# Patient Record
Sex: Female | Born: 2000 | Race: White | Hispanic: Yes | Marital: Single | Smoking: Never smoker
Health system: Southern US, Community
[De-identification: ages and names within clinical notes are randomized; demographics above are authoritative.]

## PROBLEM LIST (undated history)

## (undated) HISTORY — PX: WISDOM TOOTH EXTRACTION: SHX21

---
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
## 2021-08-14 ENCOUNTER — Encounter: Payer: Self-pay | Admitting: Emergency Medicine

## 2021-08-14 ENCOUNTER — Other Ambulatory Visit: Payer: Self-pay

## 2021-08-14 ENCOUNTER — Ambulatory Visit (INDEPENDENT_AMBULATORY_CARE_PROVIDER_SITE_OTHER): Payer: 59

## 2021-08-14 ENCOUNTER — Ambulatory Visit
Admission: EM | Admit: 2021-08-14 | Discharge: 2021-08-14 | Disposition: A | Discharge status: Home / Self Care | Payer: 59 | Attending: Emergency Medicine | Admitting: Emergency Medicine

## 2021-08-14 DIAGNOSIS — Z112 Encounter for screening for other bacterial diseases: Secondary | ICD-10-CM | POA: Insufficient documentation

## 2021-08-14 DIAGNOSIS — R042 Hemoptysis: Secondary | ICD-10-CM | POA: Insufficient documentation

## 2021-08-14 DIAGNOSIS — R21 Rash and other nonspecific skin eruption: Secondary | ICD-10-CM | POA: Diagnosis not present

## 2021-08-14 LAB — PROTIME-INR
INR: 1.1 (ref 0.8–1.2)
Prothrombin Time: 14.5 seconds (ref 11.4–15.2)

## 2021-08-14 LAB — APTT: aPTT: 34 seconds (ref 24–36)

## 2021-08-14 LAB — COMPREHENSIVE METABOLIC PANEL
ALT: 12 U/L (ref 0–44)
AST: 19 U/L (ref 15–41)
Albumin: 4.5 g/dL (ref 3.5–5.0)
Alkaline Phosphatase: 57 U/L (ref 38–126)
Anion gap: 5 (ref 5–15)
BUN: 15 mg/dL (ref 6–20)
CO2: 27 mmol/L (ref 22–32)
Calcium: 9.5 mg/dL (ref 8.9–10.3)
Chloride: 104 mmol/L (ref 98–111)
Creatinine, Ser: 0.59 mg/dL (ref 0.44–1.00)
GFR, Estimated: >60 mL/min (ref >60)
Glucose, Bld: 103 mg/dL — ABNORMAL HIGH (ref 70–99)
Potassium: 3.9 mmol/L (ref 3.5–5.1)
Sodium: 136 mmol/L (ref 135–145)
Total Bilirubin: 0.6 mg/dL (ref 0.3–1.2)
Total Protein: 8 g/dL (ref 6.5–8.1)

## 2021-08-14 LAB — CBC WITH DIFFERENTIAL/PLATELET
Abs Immature Granulocytes: 0.01 10*3/uL (ref 0.00–0.07)
Basophils Absolute: 0 10*3/uL (ref 0.0–0.1)
Basophils Relative: 1 %
Eosinophils Absolute: 0 10*3/uL (ref 0.0–0.5)
Eosinophils Relative: 1 %
HCT: 39.4 % (ref 36.0–46.0)
Hemoglobin: 12.8 g/dL (ref 12.0–15.0)
Immature Granulocytes: 0 %
Lymphocytes Relative: 46 %
Lymphs Abs: 1.8 10*3/uL (ref 0.7–4.0)
MCH: 27.9 pg (ref 26.0–34.0)
MCHC: 32.5 g/dL (ref 30.0–36.0)
MCV: 85.8 fL (ref 80.0–100.0)
Monocytes Absolute: 0.2 10*3/uL (ref 0.1–1.0)
Monocytes Relative: 6 %
Neutro Abs: 1.8 10*3/uL (ref 1.7–7.7)
Neutrophils Relative %: 46 %
Platelets: 275 10*3/uL (ref 150–400)
RBC: 4.59 MIL/uL (ref 3.87–5.11)
RDW: 13.9 % (ref 11.5–15.5)
WBC: 3.8 10*3/uL — ABNORMAL LOW (ref 4.0–10.5)
nRBC: 0 % (ref 0.0–0.2)

## 2021-08-14 LAB — GROUP A STREP BY PCR: Group A Strep by PCR: NOT DETECTED

## 2021-08-14 NOTE — Discharge Instructions (Addendum)
-  Your chest x-ray and lab work are normal so far.  We did not see any changes on the chest x-ray, though I am concerned that you have been coughing up blood for 6 months.  The strep test is negative.  Your lab work is normal so far, but we are still waiting on the coagulation studies. -Please follow-up with your primary care provider given chronicity of symptoms, you would likely benefit from referral to dermatology, allergy, pulmonology. -If symptoms get worse, including new shortness of breath, chest pain, new cough and increasing amount of blood, dizziness, weakness-head to the emergency department or call 911.

## 2021-08-14 NOTE — ED Provider Notes (Signed)
[facility]    CSN: [id number] Arrival date & time: 08/14/21  1202      History   Chief Complaint Chief Complaint  Patient presents with   Rash    HPI Miranda Patel is a 21 y.o. female presenting with 1 small red bump on her left cheek for 2 days; rash intermittently for 6 months; hemoptysis.  No formal diagnosis of strep or pulmonary disease, but she states that she was clinically diagnosed with strep 6 months ago at around the same time the rash occurred then.  She states that she has intermittently had small red papules on her thighs, face for the last 6 months, she has not identified triggers but they disappear after a few days.  They are not associated with pain or itching.  She states that she has had several episodes of hemoptysis over the last 6 months, most recently 1 day ago.  She states that she coughed up a large chunk of dark red blood.  She is currently feeling well, without sore throat cough, congestion, shortness of breath, chest pain, dizziness, weakness, fevers.  She states that she does occasionally feel short of breath, but she attributes this to anxiousness. She is not having vaginal or urinary symptoms, denies abd pain or flank pain. She is currently menstruating. Denies recent travel, prolonged immobilization, recent surgery, recent trauma, OCP use, history of clots, history of DVT, history of PE, cigarette smoking. She did eat salmon one day ago, but denies outdoor exposure, new products, new fragrances. Denies sensation of throat closing, facial swelling, current SOB or CP. Here today with mom. Denies night sweats or weight loss.    HPI  History reviewed. No pertinent past medical history.  There are no problems to display for this patient.   History reviewed. No pertinent surgical history.  OB History   No obstetric history on file.      Home Medications    Prior to Admission medications   Not on File    Family History History  reviewed. No pertinent family history.  Social History Social History   Tobacco Use   Smoking status: Never   Smokeless tobacco: Never  Vaping Use   Vaping Use: Never used  Substance Use Topics   Alcohol use: Never     Allergies   Patient has no known allergies.   Review of Systems Review of Systems  Skin:  Positive for rash.  All other systems reviewed and are negative.   Physical Exam Triage Vital Signs ED Triage Vitals  Enc Vitals Group     BP 08/14/21 1235 117/85     Pulse Rate 08/14/21 1235 90     Resp 08/14/21 1235 14     Temp 08/14/21 1235 98.6 F (37 C)     Temp Source 08/14/21 1235 Oral     SpO2 08/14/21 1235 100 %     Weight 08/14/21 1231 93 lb (42.2 kg)     Height 08/14/21 1231 5' 2.5" (1.588 m)     Head Circumference --      Peak Flow --      Pain Score 08/14/21 1231 0     Pain Loc --      Pain Edu? --      Excl. in [place]? --    No data found.  Updated Vital Signs BP 117/85 (BP Location: Right Arm)   Pulse 90   Temp 98.6 F (37 C) (Oral)   Resp 14   Ht  5' 2.5" (1.588 m)   Wt 93 lb (42.2 kg)   LMP 08/09/2021 (Exact Date)   SpO2 100%   BMI 16.74 kg/m   Visual Acuity Right Eye Distance:   Left Eye Distance:   Bilateral Distance:    Right Eye Near:   Left Eye Near:    Bilateral Near:     Physical Exam Vitals reviewed.  Constitutional:      General: She is not in acute distress.    Appearance: Normal appearance. She is not ill-appearing or diaphoretic.  HENT:     Head: Normocephalic and atraumatic.     Mouth/Throat:     Comments: Tonsils are small and nonerythematous. On exam, uvula is midline, she is tolerating her secretions without difficulty, there is no trismus, no drooling, she has normal phonation  Cardiovascular:     Rate and Rhythm: Normal rate and regular rhythm.     Heart sounds: Normal heart sounds.     Comments: Negative homan sign bilaterally. Calves are equal and symmetric. Pulmonary:     Effort: Pulmonary effort  is normal.     Breath sounds: Normal breath sounds. No decreased breath sounds, wheezing, rhonchi or rales.  Musculoskeletal:     Right lower leg: No edema.     Left lower leg: No edema.  Skin:    General: Skin is warm.     Findings: Petechiae present.     Comments: There is one small 38mm erythematous papule on L cheek. No other rash or lesion. No lip, tongue, uvula involvement.  Neurological:     General: No focal deficit present.     Mental Status: She is alert and oriented to person, place, and time.  Psychiatric:        Mood and Affect: Mood normal.        Behavior: Behavior normal.        Thought Content: Thought content normal.        Judgment: Judgment normal.     UC Treatments / Results  Labs (all labs ordered are listed, but only abnormal results are displayed) Labs Reviewed  CBC WITH DIFFERENTIAL/PLATELET - Abnormal; Notable for the following components:      Result Value   WBC 3.8 (*)    All other components within normal limits  COMPREHENSIVE METABOLIC PANEL - Abnormal; Notable for the following components:   Glucose, Bld 103 (*)    All other components within normal limits  GROUP A STREP BY PCR  APTT  PROTIME-INR    EKG   Radiology DG Chest 2 View  Result Date: 08/14/2021 CLINICAL DATA:  Intermittent hemoptysis x6 months EXAM: CHEST - 2 VIEW COMPARISON:  Images of previous study done on 07/30/2003 are not available for comparison. FINDINGS: Cardiac size is within normal limits. Increase in AP diameter of chest and flattening of diaphragms may be normal variation due to patient's body habitus or suggest air trapping. There is no significant peribronchial thickening. There is no focal pulmonary consolidation. There is no pleural effusion or pneumothorax. IMPRESSION: No focal pulmonary infiltrates are seen. No discrete lung nodules are seen. There is no pleural effusion or pneumothorax. Increase in AP diameter of chest may be normal variation due to patient's body  habitus or suggest air trapping. Electronically Signed   By: Ernie Avena M.D.   On: 08/14/2021 13:39    Procedures Procedures (including critical care time)  Medications Ordered in UC Medications - No data to display  Initial Impression / Assessment and Plan /  UC Course  I have reviewed the triage vital signs and the nursing notes.  Pertinent labs & imaging results that were available during my care of the patient were reviewed by me and considered in my medical decision making (see chart for details).     This patient is a very pleasant 21 y.o. year old female presenting with one small erythematous papule L cheek. Afebrile, nontachy, no adventitious breath sounds.  She has apparently been dealing with intermittent petechiae and hemoptysis for the last 6 months, this is the first time she has sought care for this.  Today, the only symptom is the one small papule on the left cheek. There is no associated chest pain, shortness of breath, weight loss, fevers, sore throat.  Strep PCR is negative. CXR - No focal pulmonary infiltrates are seen. No discrete lung nodules are seen. There is no pleural effusion or pneumothorax. CBC and CMP today are wnl. Will send a PTT and INR given 6 months of intermittent petechiae (per pt). Wells score for DVT is 0, Perc score is 0.  No recent travel, immobilization, OCP use, cigarette smoking.  No recent exposures to the outdoors, fragrance, or new foods.  Reassurance provided today, but strongly advised follow-up with PCP, as she could benefit from monitoring and referral to dermatology.  Mom and patient are in agreement.    Final Clinical Impressions(s) / UC Diagnoses   Final diagnoses:  None   Discharge Instructions   None    ED Prescriptions   None    PDMP not reviewed this encounter.

## 2021-08-14 NOTE — ED Triage Notes (Signed)
Patient reports red bumps on the side of her face and inside her mouth that started 2 nights ago.  Patient denies any sore throat.  Patient denies fevers.

## 2022-04-09 ENCOUNTER — Ambulatory Visit
Admission: EM | Admit: 2022-04-09 | Discharge: 2022-04-09 | Disposition: A | Discharge status: Home / Self Care | Payer: Managed Care, Other (non HMO) | Attending: Physician Assistant | Admitting: Physician Assistant

## 2022-04-09 ENCOUNTER — Encounter: Payer: Self-pay | Admitting: Emergency Medicine

## 2022-04-09 DIAGNOSIS — R519 Headache, unspecified: Secondary | ICD-10-CM

## 2022-04-09 DIAGNOSIS — R202 Paresthesia of skin: Secondary | ICD-10-CM | POA: Insufficient documentation

## 2022-04-09 DIAGNOSIS — R000 Tachycardia, unspecified: Secondary | ICD-10-CM

## 2022-04-09 DIAGNOSIS — R42 Dizziness and giddiness: Secondary | ICD-10-CM | POA: Diagnosis not present

## 2022-04-09 LAB — CBC WITH DIFFERENTIAL/PLATELET
Abs Immature Granulocytes: 0.02 10*3/uL (ref 0.00–0.07)
Basophils Absolute: 0 10*3/uL (ref 0.0–0.1)
Basophils Relative: 1 %
Eosinophils Absolute: 0 10*3/uL (ref 0.0–0.5)
Eosinophils Relative: 0 %
HCT: 38.5 % (ref 36.0–46.0)
Hemoglobin: 13 g/dL (ref 12.0–15.0)
Immature Granulocytes: 0 %
Lymphocytes Relative: 20 %
Lymphs Abs: 1.2 10*3/uL (ref 0.7–4.0)
MCH: 28 pg (ref 26.0–34.0)
MCHC: 33.8 g/dL (ref 30.0–36.0)
MCV: 83 fL (ref 80.0–100.0)
Monocytes Absolute: 0.4 10*3/uL (ref 0.1–1.0)
Monocytes Relative: 7 %
Neutro Abs: 4.3 10*3/uL (ref 1.7–7.7)
Neutrophils Relative %: 72 %
Platelets: 343 10*3/uL (ref 150–400)
RBC: 4.64 MIL/uL (ref 3.87–5.11)
RDW: 15.4 % (ref 11.5–15.5)
WBC: 5.9 10*3/uL (ref 4.0–10.5)
nRBC: 0 % (ref 0.0–0.2)

## 2022-04-09 LAB — COMPREHENSIVE METABOLIC PANEL
ALT: 13 U/L (ref 0–44)
AST: 18 U/L (ref 15–41)
Albumin: 4.6 g/dL (ref 3.5–5.0)
Alkaline Phosphatase: 57 U/L (ref 38–126)
Anion gap: 9 (ref 5–15)
BUN: 18 mg/dL (ref 6–20)
CO2: 25 mmol/L (ref 22–32)
Calcium: 9.6 mg/dL (ref 8.9–10.3)
Chloride: 103 mmol/L (ref 98–111)
Creatinine, Ser: 0.63 mg/dL (ref 0.44–1.00)
GFR, Estimated: >60 mL/min (ref >60)
Glucose, Bld: 110 mg/dL — ABNORMAL HIGH (ref 70–99)
Potassium: 4.2 mmol/L (ref 3.5–5.1)
Sodium: 137 mmol/L (ref 135–145)
Total Bilirubin: 0.7 mg/dL (ref 0.3–1.2)
Total Protein: 7.8 g/dL (ref 6.5–8.1)

## 2022-04-09 LAB — URINALYSIS, ROUTINE W REFLEX MICROSCOPIC
Bilirubin Urine: NEGATIVE
Glucose, UA: NEGATIVE mg/dL
Ketones, ur: 15 mg/dL — AB
Nitrite: NEGATIVE
Protein, ur: NEGATIVE mg/dL
Specific Gravity, Urine: 1.015 (ref 1.005–1.030)
pH: 6 (ref 5.0–8.0)

## 2022-04-09 LAB — PREGNANCY, URINE: Preg Test, Ur: NEGATIVE

## 2022-04-09 LAB — GLUCOSE, CAPILLARY: Glucose-Capillary: 109 mg/dL — ABNORMAL HIGH (ref 70–99)

## 2022-04-09 LAB — URINALYSIS, MICROSCOPIC (REFLEX)

## 2022-04-09 NOTE — ED Triage Notes (Signed)
Patient is with her mother today.  Mother states that her daughter has c/o SOB, chest pain and fast heart that started today.  Mother states that she was seen Friday on [facility] ED for headaches and anxiety/panic attacks.

## 2022-04-09 NOTE — Discharge Instructions (Signed)
Please increase your salt consumption as well as drink plenty of fluid.  I would like you to follow-up with a cardiologist.  Please call them to schedule an appointment.  We will contact you if any of your other blood work is abnormal.  If you have any syncopal episodes or worsening symptoms you need to go to the emergency room.  Follow-up with neurology as recommended by the ER.

## 2022-04-09 NOTE — ED Provider Notes (Signed)
[facility]    CSN: [id number] Arrival date & time: 04/09/22  1139      History   Chief Complaint Chief Complaint  Patient presents with   Anxiety   Headache   Dizziness    HPI Miranda Patel is a 22 y.o. female.   Patient presents today companied by her mother who provide the majority of history.  Reports that for the past month she has had intermittent symptoms including numbness/paresthesias in her right arm, lightheadedness/near syncope, shortness of breath, heart racing, headache with radiation to the eye.  She has been evaluated and previously was attributed to anxiety.  She was seen several days ago in the emergency room at which point she had a negative workup including negative MRI.  She denies any recent medication changes.  Reports she is eating and drinking normally.  She denies personal or family history of neurological condition including multiple sclerosis.  Denies any alcohol or drug use.  She has not identified any triggers that cause symptoms.  She has not had any syncopal episodes.  She has tried Atarax as prescribed by the emergency room but this has provided no relief of symptoms and in fact she felt worse today.  She reports that 3 weeks ago she had COVID but has recovered completely.  Her symptoms began prior to this illness.  She denies any history of diabetes.  She denies any heavy metal exposure.  Reports she is having difficulty with daily activities as a result of symptoms.    History reviewed. No pertinent past medical history.  There are no problems to display for this patient.   Past Surgical History:  Procedure Laterality Date   WISDOM TOOTH EXTRACTION      OB History   No obstetric history on file.      Home Medications    Prior to Admission medications   Medication Sig Start Date End Date Taking? Authorizing Provider  hydrOXYzine (ATARAX) 25 MG tablet Take by mouth. 02/24/22  Yes [provider]    Family  History History reviewed. No pertinent family history.  Social History Social History   Tobacco Use   Smoking status: Never   Smokeless tobacco: Never  Vaping Use   Vaping Use: Never used  Substance Use Topics   Alcohol use: Never     Allergies   Patient has no known allergies.   Review of Systems Review of Systems  Constitutional:  Positive for activity change. Negative for appetite change, fatigue and fever.  Eyes:  Negative for photophobia and visual disturbance.  Respiratory:  Negative for cough and shortness of breath.   Cardiovascular:  Negative for chest pain.  Gastrointestinal:  Negative for abdominal pain, diarrhea, nausea and vomiting.  Neurological:  Positive for light-headedness, numbness and headaches. Negative for dizziness, syncope, facial asymmetry, speech difficulty and weakness.     Physical Exam Triage Vital Signs ED Triage Vitals  Enc Vitals Group     BP 04/09/22 1249 (!) 131/90     Pulse Rate 04/09/22 1249 (!) 108     Resp 04/09/22 1249 14     Temp 04/09/22 1249 98 F (36.7 C)     Temp Source 04/09/22 1249 Oral     SpO2 04/09/22 1249 99 %     Weight 04/09/22 1247 93 lb 0.6 oz (42.2 kg)     Height 04/09/22 1247 5' 2.5" (1.588 m)     Head Circumference --      Peak Flow --  Pain Score 04/09/22 1247 0     Pain Loc --      Pain Edu? --      Excl. in [place]? --    No data found.  Updated Vital Signs BP (!) 131/90 (BP Location: Left Arm)   Pulse (!) 108   Temp 98 F (36.7 C) (Oral)   Resp 14   Ht 5' 2.5" (1.588 m)   Wt 93 lb 0.6 oz (42.2 kg)   LMP 03/26/2022   SpO2 99%   BMI 16.75 kg/m   Orthostatic vital signs: Lying down: BP 110/74 and heart rate 79 Sitting: BP 106/70 and heart rate of 98 Standing at 3 minutes: 99/65 with heart rate of 119  Visual Acuity Right Eye Distance:   Left Eye Distance:   Bilateral Distance:    Right Eye Near:   Left Eye Near:    Bilateral Near:     Physical Exam Vitals reviewed.   Constitutional:      General: She is awake. She is not in acute distress.    Appearance: Normal appearance. She is well-developed. She is not ill-appearing.     Comments: Very pleasant female appears stated age in no acute distress sitting comfortably in exam room  HENT:     Head: Normocephalic and atraumatic. No raccoon eyes, Battle's sign or contusion.     Right Ear: Tympanic membrane, ear canal and external ear normal. No hemotympanum.     Left Ear: Tympanic membrane, ear canal and external ear normal. No hemotympanum.     Mouth/Throat:     Tongue: Tongue does not deviate from midline.     Pharynx: Uvula midline. No oropharyngeal exudate or posterior oropharyngeal erythema.  Eyes:     Extraocular Movements: Extraocular movements intact.     Pupils: Pupils are equal, round, and reactive to light.  Cardiovascular:     Rate and Rhythm: Normal rate and regular rhythm.     Heart sounds: Normal heart sounds, S1 normal and S2 normal. No murmur heard. Pulmonary:     Effort: Pulmonary effort is normal.     Breath sounds: Normal breath sounds. No wheezing, rhonchi or rales.     Comments: Clear to auscultation bilaterally Musculoskeletal:     Cervical back: No spinous process tenderness or muscular tenderness.     Comments: Strength 5/5 bilateral upper and lower extremities  Lymphadenopathy:     Head:     Right side of head: No submental, submandibular or tonsillar adenopathy.     Left side of head: No submental, submandibular or tonsillar adenopathy.  Neurological:     General: No focal deficit present.     Mental Status: She is alert and oriented to person, place, and time.     Cranial Nerves: Cranial nerves 2-12 are intact.     Motor: Motor function is intact.     Coordination: Coordination is intact.     Gait: Gait is intact.     Comments: No focal neurological defect on exam  Psychiatric:        Behavior: Behavior is cooperative.      UC Treatments / Results  Labs (all labs  ordered are listed, but only abnormal results are displayed) Labs Reviewed  COMPREHENSIVE METABOLIC PANEL - Abnormal; Notable for the following components:      Result Value   Glucose, Bld 110 (*)    All other components within normal limits  URINALYSIS, ROUTINE W REFLEX MICROSCOPIC - Abnormal; Notable for the following components:  Hgb urine dipstick TRACE (*)    Ketones, ur 15 (*)    Leukocytes,Ua SMALL (*)    All other components within normal limits  GLUCOSE, CAPILLARY - Abnormal; Notable for the following components:   Glucose-Capillary 109 (*)    All other components within normal limits  URINALYSIS, MICROSCOPIC (REFLEX) - Abnormal; Notable for the following components:   Bacteria, UA MANY (*)    All other components within normal limits  URINE CULTURE  CBC WITH DIFFERENTIAL/PLATELET  PREGNANCY, URINE  LYME DISEASE SEROLOGY W/REFLEX  VITAMIN B12  HEMOGLOBIN A1C  CBG MONITORING, ED    EKG   Radiology No results found.  Procedures Procedures (including critical care time)  Medications Ordered in UC Medications - No data to display  Initial Impression / Assessment and Plan / UC Course  I have reviewed the triage vital signs and the nursing notes.  Pertinent labs & imaging results that were available during my care of the patient were reviewed by me and considered in my medical decision making (see chart for details).     EKG was obtained that showed normal sinus rhythm with ventricular rate of 87 bpm with biphasic T waves in V3 but no ischemic changes; no previous to compare.  Her blood sugar was appropriate.  She did have a near syncopal episode during blood draw but since recovered with no ongoing symptoms.  UA shows some bacteria but also shows some squamous cells.  Will send this for culture but defer antibiotics.  Urine pregnancy was negative.  Orthostatic vital signs did show elevated heart rate with lying to standing concerning for POTS.  Recommended follow-up  with cardiology and she was given contact information for local provider with instruction call to schedule an appointment.  CBC and CMP were unremarkable.  Lyme, B12, A1c are pending.  She was encouraged to eat more salt and drink plenty of fluid.   She already has follow-up scheduled with neurology with strongly encouraged to keep this appointment.  Discussed that if she has any worsening or changing symptoms she is to go to the emergency room immediately.  Strict return precautions given. School excuse note provided.   Final Clinical Impressions(s) / UC Diagnoses   Final diagnoses:  Episodic lightheadedness  Tachycardia  Paresthesias  Nonintractable headache, unspecified chronicity pattern, unspecified headache type     Discharge Instructions      Please increase your salt consumption as well as drink plenty of fluid.  I would like you to follow-up with a cardiologist.  Please call them to schedule an appointment.  We will contact you if any of your other blood work is abnormal.  If you have any syncopal episodes or worsening symptoms you need to go to the emergency room.  Follow-up with neurology as recommended by the ER.     ED Prescriptions   None    PDMP not reviewed this encounter.   Jeani Hawking, PA-C 04/09/22 1655

## 2022-04-10 ENCOUNTER — Emergency Department: Payer: Managed Care, Other (non HMO)

## 2022-04-10 ENCOUNTER — Other Ambulatory Visit: Payer: Self-pay

## 2022-04-10 ENCOUNTER — Emergency Department
Admission: EM | Admit: 2022-04-10 | Discharge: 2022-04-10 | Disposition: A | Discharge status: Home / Self Care | Payer: Managed Care, Other (non HMO) | Attending: Emergency Medicine | Admitting: Emergency Medicine

## 2022-04-10 ENCOUNTER — Encounter: Payer: Self-pay | Admitting: Emergency Medicine

## 2022-04-10 DIAGNOSIS — R531 Weakness: Secondary | ICD-10-CM | POA: Diagnosis not present

## 2022-04-10 DIAGNOSIS — R519 Headache, unspecified: Secondary | ICD-10-CM | POA: Diagnosis present

## 2022-04-10 DIAGNOSIS — U071 COVID-19: Secondary | ICD-10-CM | POA: Diagnosis not present

## 2022-04-10 LAB — COMPREHENSIVE METABOLIC PANEL
ALT: 12 U/L (ref 0–44)
AST: 16 U/L (ref 15–41)
Albumin: 4.5 g/dL (ref 3.5–5.0)
Alkaline Phosphatase: 52 U/L (ref 38–126)
Anion gap: 10 (ref 5–15)
BUN: 11 mg/dL (ref 6–20)
CO2: 25 mmol/L (ref 22–32)
Calcium: 9.4 mg/dL (ref 8.9–10.3)
Chloride: 106 mmol/L (ref 98–111)
Creatinine, Ser: 0.65 mg/dL (ref 0.44–1.00)
GFR, Estimated: >60 mL/min (ref >60)
Glucose, Bld: 93 mg/dL (ref 70–99)
Potassium: 4.1 mmol/L (ref 3.5–5.1)
Sodium: 141 mmol/L (ref 135–145)
Total Bilirubin: 0.8 mg/dL (ref 0.3–1.2)
Total Protein: 7.4 g/dL (ref 6.5–8.1)

## 2022-04-10 LAB — CBC WITH DIFFERENTIAL/PLATELET
Abs Immature Granulocytes: 0.02 10*3/uL (ref 0.00–0.07)
Basophils Absolute: 0 10*3/uL (ref 0.0–0.1)
Basophils Relative: 1 %
Eosinophils Absolute: 0.1 10*3/uL (ref 0.0–0.5)
Eosinophils Relative: 1 %
HCT: 40 % (ref 36.0–46.0)
Hemoglobin: 12.6 g/dL (ref 12.0–15.0)
Immature Granulocytes: 0 %
Lymphocytes Relative: 23 %
Lymphs Abs: 1.1 10*3/uL (ref 0.7–4.0)
MCH: 27.5 pg (ref 26.0–34.0)
MCHC: 31.5 g/dL (ref 30.0–36.0)
MCV: 87.1 fL (ref 80.0–100.0)
Monocytes Absolute: 0.2 10*3/uL (ref 0.1–1.0)
Monocytes Relative: 5 %
Neutro Abs: 3.2 10*3/uL (ref 1.7–7.7)
Neutrophils Relative %: 70 %
Platelets: 305 10*3/uL (ref 150–400)
RBC: 4.59 MIL/uL (ref 3.87–5.11)
RDW: 15.3 % (ref 11.5–15.5)
WBC: 4.5 10*3/uL (ref 4.0–10.5)
nRBC: 0 % (ref 0.0–0.2)

## 2022-04-10 LAB — HEMOGLOBIN A1C
Hgb A1c MFr Bld: 5.1 % (ref 4.8–5.6)
Mean Plasma Glucose: 99.67 mg/dL

## 2022-04-10 LAB — VITAMIN B12: Vitamin B-12: 593 pg/mL (ref 180–914)

## 2022-04-10 LAB — RESP PANEL BY RT-PCR (RSV, FLU A&B, COVID)  RVPGX2
Influenza A by PCR: NEGATIVE
Influenza B by PCR: NEGATIVE
Resp Syncytial Virus by PCR: NEGATIVE
SARS Coronavirus 2 by RT PCR: POSITIVE — AB

## 2022-04-10 LAB — TSH: TSH: 0.563 u[IU]/mL (ref 0.350–4.500)

## 2022-04-10 MED ORDER — CEPHALEXIN 500 MG PO CAPS: 1000.0000 mg | ORAL_CAPSULE | Freq: Two times a day (BID) | ORAL | 0 refills | Status: AC

## 2022-04-10 MED ORDER — ACETAMINOPHEN 500 MG PO TABS
1000.0000 mg | ORAL_TABLET | Freq: Once | ORAL | Status: AC
  Administered 2022-04-10: 1000 mg via ORAL
  Filled 2022-04-10: qty 2

## 2022-04-10 NOTE — Discharge Instructions (Addendum)
-  We are unsure exactly what is causing your symptoms, but it does not appear to be anything serious or life-threatening at this time based off of our lab work and scans.  Please follow-up with the specialist as discussed.  -It is possible that you have a urinary tract infection.  You may take the cephalexin to see if this helps improve some of your symptoms.  -You may follow-up with the results of your COVID-19 and influenza testing online.  If positive, there are no specific prescription medications that will help cure this.  Your body will naturally clear on its own.  Just take over-the-counter medications as needed to help manage her symptoms.  -Follow-up with your primary care provider as needed.  -Return to the emergency department anytime if you begin to experience any new or worsening symptoms.

## 2022-04-10 NOTE — ED Provider Notes (Signed)
[facility] Provider Note    Event Date/Time   First MD Initiated Contact with Patient 04/10/22 1418     (approximate)   History   Chief Complaint Headache   HPI Miranda Patel is a 22 y.o. female, no significant medical history, presents to the emergency department for evaluation of several symptoms.  She states that for the past month, she has had intermittent episodes of headaches, paresthesias in her right arm, lightheadedness, and hot flashes.  She was seen yesterday in urgent care yesterday, where they performed laboratory testing and provide her with a referral to cardiology and neurology for further evaluation.  Since then, she states that she has had increased chest pain and shortness of breath intermittently appears to come and go.  Denies abdominal pain, flank pain, nausea/vomiting, diarrhea, urinary symptoms, headache, weakness, vision change, hearing changes, or rash/lesions.   History Limitations:         Physical Exam  Triage Vital Signs: ED Triage Vitals  Enc Vitals Group     BP 04/10/22 1402 108/84     Pulse Rate 04/10/22 1402 100     Resp 04/10/22 1402 17     Temp 04/10/22 1402 98.6 F (37 C)     Temp Source 04/10/22 1402 Oral     SpO2 04/10/22 1402 98 %     Weight 04/10/22 1402 87 lb (39.5 kg)     Height 04/10/22 1417 5' 2.25" (1.581 m)     Head Circumference --      Peak Flow --      Pain Score 04/10/22 1402 0     Pain Loc --      Pain Edu? --      Excl. in [place]? --     Most recent vital signs: Vitals:   04/10/22 1402 04/10/22 1742  BP: 108/84 110/78  Pulse: 100 90  Resp: 17 16  Temp: 98.6 F (37 C) 98 F (36.7 C)  SpO2: 98% 98%    General: Awake, NAD.  Skin: Warm, dry. No rashes or lesions.  Eyes: PERRL. Conjunctivae normal.  CV: Good peripheral perfusion.  S1 and S2 present.  No murmurs, rubs, or gallops. Resp: Normal effort.  Lung sounds clear bilaterally. Abd: Soft, non-tender. No distention.  Neuro: At  baseline. No gross neurological deficits.  5/5 strength and sensation in both upper and lower extremities.  Cranial nerves II through XII intact. Musculoskeletal: Normal ROM of all extremities.  Physical Exam    ED Results / Procedures / Treatments  Labs (all labs ordered are listed, but only abnormal results are displayed) Labs Reviewed  RESP PANEL BY RT-PCR (RSV, FLU A&B, COVID)  RVPGX2 - Abnormal; Notable for the following components:      Result Value   SARS Coronavirus 2 by RT PCR POSITIVE (*)    All other components within normal limits  TSH  CBC WITH DIFFERENTIAL/PLATELET  COMPREHENSIVE METABOLIC PANEL     EKG Sinus rhythm, rate of 92, short PR interval, no significant ST segment changes, normal QRS, no QT prolongation.    RADIOLOGY  ED Provider Interpretation: I personally reviewed and interpreted this chest x-ray, no evidence of acute cardiopulmonary disease.  CT Head Wo Contrast  Result Date: 04/10/2022 CLINICAL DATA:  Headache, increasing frequency or severity. EXAM: CT HEAD WITHOUT CONTRAST TECHNIQUE: Contiguous axial images were obtained from the base of the skull through the vertex without intravenous contrast. RADIATION DOSE REDUCTION: This exam was performed according to the departmental  dose-optimization program which includes automated exposure control, adjustment of the mA and/or kV according to patient size and/or use of iterative reconstruction technique. COMPARISON:  None Available. FINDINGS: Brain: There is no evidence of an acute infarct, intracranial hemorrhage, mass, midline shift, or extra-axial fluid collection. The ventricles and sulci are normal. The cerebellar tonsils are normally positioned. Vascular: No hyperdense vessel. Skull: No fracture or suspicious osseous lesion. Sinuses/Orbits: Visualized paranasal sinuses and mastoid air cells are clear. Visualized portions of the orbits are unremarkable. Other: None. IMPRESSION: Negative head CT.  Electronically Signed   By: Logan Bores M.D.   On: 04/10/2022 16:30   DG Chest 2 View  Result Date: 04/10/2022 CLINICAL DATA:  Shortness of breath. EXAM: CHEST - 2 VIEW COMPARISON:  08/14/2021 FINDINGS: Lungs are adequately inflated without focal airspace consolidation or effusion. Cardiomediastinal silhouette, bones and soft tissues are normal. IMPRESSION: No active cardiopulmonary disease. Electronically Signed   By: Marin Olp M.D.   On: 04/10/2022 16:01    PROCEDURES:  Critical Care performed: N/A.  Procedures    MEDICATIONS ORDERED IN ED: Medications  acetaminophen (TYLENOL) tablet 1,000 mg (1,000 mg Oral Given 04/10/22 1649)     IMPRESSION / MDM / ASSESSMENT AND PLAN / ED COURSE  I reviewed the triage vital signs and the nursing notes.                              Differential diagnosis includes, but is not limited to, arrhythmias, urinary tract infection, anxiety/depression, pneumonia, COVID-19, influenza, RSV, tension headache, migraine headache  ED Course Patient appears well, vitals within normal limits.  NAD.  CBC shows no leukocytosis or anemia.  CMP shows no electrolyte abnormalities, transaminitis, or AKI.  TSH unremarkable at 0.563.  Assessment/Plan Patient presents with several symptoms, including weakness, intermittent paresthesias, intermittent headaches, heart palpitations, and shortness of breath.  She appears well clinically.  No remarkable physical exam findings.  No evidence of neurological deficits.  She is not currently in any distress at this time.  Her lab workup is reassuring.  Pending respiratory panel, though unlikely to change management this time.  Very low suspicion for any infectious etiologies.  Low suspicion for pulmonary embolism, she is PERC negative.  Chest x-ray shows no acute abnormalities.  Head CT shows no acute intracranial abnormalities.  Unsure of the exact etiology of her symptoms, I suspect that anxiety may be a component of her  symptoms.  She does endorse some hot flashes periodically.  Given that her urinalysis did show some signs of possible urinary tract infection, will provide her with a short course of antibiotics to treat this.  Otherwise, advised her and her mother to follow-up with her PCP, as well as the appointments with neurologist and cardiologist.  Patient expressed understanding and agreed.  Will discharge.  Considered admission for this patient, but given her stable presentation and unremarkable workup, she is unlikely benefit from admission.  Provided the patient with anticipatory guidance, return precautions, and educational material. Encouraged the patient to return to the emergency department at any time if they begin to experience any new or worsening symptoms. Patient expressed understanding and agreed with the plan.   Patient's presentation is most consistent with acute complicated illness / injury requiring diagnostic workup.       FINAL CLINICAL IMPRESSION(S) / ED DIAGNOSES   Final diagnoses:  Weakness     Rx / DC Orders   ED Discharge Orders  Ordered    cephALEXin (KEFLEX) 500 MG capsule  2 times daily        04/10/22 1803             Note:  This document was prepared using Dragon voice recognition software and may include unintentional dictation errors.   Varney Daily, [place] 04/10/22 Claria Dice    Dionne Bucy, MD 04/17/22 [phone]

## 2022-04-10 NOTE — ED Triage Notes (Signed)
First Nurse: Pt here with SOB and weakness. [facility] and worked up for POTS. Pt here today with mother.

## 2022-04-10 NOTE — ED Triage Notes (Signed)
Pt arrives with mother to ED. Per mother pt was seen at [facility] emergency dept in [place] on Friday evening for a bad headache with numbness down her right arm. Pt mother sts that during the visit pt HR went up and she was having anxiety and all her blood work looked normal. Per mother pt was given a referral to [facility] neurology. Pt than also went to [facility] yesterday for the same thing and was told to refer to neurology.

## 2022-04-11 ENCOUNTER — Other Ambulatory Visit: Payer: Self-pay

## 2022-04-11 LAB — URINE CULTURE: Culture: NO GROWTH

## 2022-04-11 LAB — LYME DISEASE SEROLOGY W/REFLEX: Lyme Total Antibody EIA: NEGATIVE

## 2022-04-12 ENCOUNTER — Ambulatory Visit: Payer: Managed Care, Other (non HMO) | Attending: Cardiology | Admitting: Cardiology

## 2022-04-12 ENCOUNTER — Ambulatory Visit: Payer: Managed Care, Other (non HMO) | Attending: Cardiology

## 2022-04-12 ENCOUNTER — Ambulatory Visit (HOSPITAL_COMMUNITY): Payer: Managed Care, Other (non HMO) | Attending: Cardiology

## 2022-04-12 ENCOUNTER — Encounter: Payer: Self-pay | Admitting: Cardiology

## 2022-04-12 VITALS — BP 98/66 | HR 120 | Resp 98 | Ht 63.0 in | Wt 88.0 lb

## 2022-04-12 DIAGNOSIS — R002 Palpitations: Secondary | ICD-10-CM | POA: Diagnosis not present

## 2022-04-12 DIAGNOSIS — R0609 Other forms of dyspnea: Secondary | ICD-10-CM | POA: Insufficient documentation

## 2022-04-12 DIAGNOSIS — Z8616 Personal history of COVID-19: Secondary | ICD-10-CM | POA: Insufficient documentation

## 2022-04-12 DIAGNOSIS — R200 Anesthesia of skin: Secondary | ICD-10-CM | POA: Diagnosis not present

## 2022-04-12 DIAGNOSIS — R0602 Shortness of breath: Secondary | ICD-10-CM | POA: Diagnosis not present

## 2022-04-12 LAB — ECHOCARDIOGRAM COMPLETE
Area-P 1/2: 4.86 cm2
Height: 63 in
S' Lateral: 2.9 cm
Weight: 1408 oz

## 2022-04-12 NOTE — Patient Instructions (Addendum)
Medication Instructions:  Your physician recommends that you continue on your current medications as directed. Please refer to the Current Medication list given to you today.  *If you need a refill on your cardiac medications before your next appointment, please call your pharmacy*   Lab Work: None Ordered If you have labs (blood work) drawn today and your tests are completely normal, you will receive your results only by: MyChart Message (if you have MyChart) OR A paper copy in the mail If you have any lab test that is abnormal or we need to change your treatment, we will call you to review the results.   Testing/Procedures: Your physician has requested that you have an echocardiogram. Echocardiography is a painless test that uses sound waves to create images of your heart. It provides your doctor with information about the size and shape of your heart and how well your heart's chambers and valves are working. This procedure takes approximately one hour. There are no restrictions for this procedure. Please do NOT wear cologne, perfume, aftershave, or lotions (deodorant is allowed). Please arrive 15 minutes prior to your appointment time.=   WHY IS MY DOCTOR PRESCRIBING ZIO? The Zio system is proven and trusted by physicians to detect and diagnose irregular heart rhythms -- and has been prescribed to hundreds of thousands of patients.  The FDA has cleared the Zio system to monitor for many different kinds of irregular heart rhythms. In a study, physicians were able to reach a diagnosis 90% of the time with the Zio system1.  You can wear the Zio monitor -- a small, discreet, comfortable patch -- during your normal day-to-day activity, including while you sleep, shower, and exercise, while it records every single heartbeat for analysis.  1Barrett, P., et al. Comparison of 24 Hour Holter Monitoring Versus 14 Day Novel Adhesive Patch Electrocardiographic Monitoring. American Journal of  Medicine, 2014.  ZIO VS. HOLTER MONITORING The Zio monitor can be comfortably worn for up to 14 days. Holter monitors can be worn for 24 to 48 hours, limiting the time to record any irregular heart rhythms you may have. Zio is able to capture data for the 51% of patients who have their first symptom-triggered arrhythmia after 48 hours.1  LIVE WITHOUT RESTRICTIONS The Zio ambulatory cardiac monitor is a small, unobtrusive, and water-resistant patch--you might even forget you're wearing it. The Zio monitor records and stores every beat of your heart, whether you're sleeping, working out, or showering.     Follow-Up: At [facility], you and your health needs are our priority.  As part of our continuing mission to provide you with exceptional heart care, we have created designated Provider Care Teams.  These Care Teams include your primary Cardiologist (physician) and Advanced Practice Providers (APPs -  Physician Assistants and Nurse Practitioners) who all work together to provide you with the care you need, when you need it.  We recommend signing up for the patient portal called "MyChart".  Sign up information is provided on this After Visit Summary.  MyChart is used to connect with patients for Virtual Visits (Telemedicine).  Patients are able to view lab/test results, encounter notes, upcoming appointments, etc.  Non-urgent messages can be sent to your provider as well.   To learn more about what you can do with MyChart, go to [email or web address].    Your next appointment:   2 month(s)  The format for your next appointment:   In Person  Provider:   Robert Krasowski, MD      Other Instructions NA

## 2022-04-12 NOTE — Progress Notes (Signed)
Cardiology Consultation:    Date:  04/12/2022   ID:  Miranda Patel, DOB 12-28-2000, MRN [id number]  PCP:  Merryl Hacker, No  Cardiologist:  Jenne Campus, MD   Referring MD: No ref. provider found   Chief Complaint  Patient presents with   Shortness of Breath   Fatigue   Dizziness    Ongoing since 4 days ago    History of Present Illness:    Miranda Patel is a 22 y.o. female who is being seen today for the evaluation of palpitations, dizziness, shortness of breath at the request of No ref. provider found.  She is a young healthy woman who a few weeks ago suffered from COVID-19 infection that was 3 weeks ago.  Since that time she started having some constellation of symptoms.  She ended up being in the emergency because of right arm weakness.  Initial diagnosis was potentially having multiple sclerosis, however, MRI of the brain did not show any lesions.  Additional problem is the fact that she is complaining of having shortness of breath as well as some palpitations.  She said that shortness of breath can happen at rest that can happen also with exercise.  She does not exercise on the regular basis she is also afraid to exercise right now because of her symptoms.  She is also aware of her heart speeding up and sometimes getting up a lot which concerned her of course.  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  She is post to be in school this week but she is not because of those problems.  She is scheduled to see neurologist within the next few days.  She does not have family history of premature coronary artery disease, we do not know anything about her cholesterol she does have hypertension diabetes, her blood pressure seems to be low to begin with.  She described to have some episode of dizziness however not to the point of being syncope  History reviewed. No pertinent past medical history.  Past Surgical History:  Procedure Laterality Date   WISDOM TOOTH EXTRACTION       Current Medications: Current Meds  Medication Sig   cephALEXin (KEFLEX) 500 MG capsule Take 2 capsules (1,000 mg total) by mouth 2 (two) times daily for 7 days.   hydrOXYzine (ATARAX) 25 MG tablet Take 25 mg by mouth 2 (two) times daily.     Allergies:   Patient has no known allergies.   Social History   Socioeconomic History   Marital status: Single    Spouse name: Not on file   Number of children: Not on file   Years of education: Not on file   Highest education level: Not on file  Occupational History   Not on file  Tobacco Use   Smoking status: Never   Smokeless tobacco: Never  Vaping Use   Vaping Use: Never used  Substance and Sexual Activity   Alcohol use: Never   Drug use: Never   Sexual activity: Yes  Other Topics Concern   Not on file  Social History Narrative   Not on file   Social Determinants of Health   Financial Resource Strain: Not on file  Food Insecurity: Not on file  Transportation Needs: Not on file  Physical Activity: Not on file  Stress: Not on file  Social Connections: Not on file     Family History: The patient's family history includes Coronary artery disease in her maternal grandmother;  Diabetes in her maternal grandmother and mother; Hyperlipidemia in her maternal grandmother. ROS:   Please see the history of present illness.    All 14 point review of systems negative except as described per history of present illness.  EKGs/Labs/Other Studies Reviewed:    The following studies were reviewed today: She told me that she had MRI done which was normal however what I see in the computer is only CT of her head which was negative  Lyme titers were negative  EKG:  EKG is  ordered today.  The ekg ordered today demonstrates sinus tachycardia normal P interval, possible right atrium enlargement, right axis deviation  Recent Labs: 04/10/2022: ALT 12; BUN 11; Creatinine, Ser 0.65; Hemoglobin 12.6; Platelets 305; Potassium 4.1; Sodium 141;  TSH 0.563  Recent Lipid Panel No results found for: "CHOL", "TRIG", "HDL", "CHOLHDL", "VLDL", "LDLCALC", "LDLDIRECT"  Physical Exam:    VS:  BP 98/66 (BP Location: Left Arm, Patient Position: Sitting)   Pulse (!) 120   Resp (!) 98   Ht 5\' 3"  (1.6 m)   Wt 88 lb (39.9 kg)   LMP  (LMP Unknown)   BMI 15.59 kg/m     Wt Readings from Last 3 Encounters:  04/12/22 88 lb (39.9 kg)  04/10/22 86 lb 13.8 oz (39.4 kg)  04/09/22 93 lb 0.6 oz (42.2 kg)     GEN:  Well nourished, well developed in no acute distress HEENT: Normal NECK: No JVD; No carotid bruits LYMPHATICS: No lymphadenopathy CARDIAC: RRR, no murmurs, no rubs, no gallops RESPIRATORY:  Clear to auscultation without rales, wheezing or rhonchi  ABDOMEN: Soft, non-tender, non-distended MUSCULOSKELETAL:  No edema; No deformity  SKIN: Warm and dry NEUROLOGIC:  Alert and oriented x 3 PSYCHIATRIC:  Normal affect   ASSESSMENT:    1. Dyspnea on exertion   2. Palpitations   3. Shortness of breath   4. Right arm numbness   5. History of COVID-19    PLAN:    In order of problems listed above:  Constellation of symptoms.  I will ask her to have echocardiogram to assess left ventricle ejection fraction.  She does have abnormal EKG which also need to be clarified by doing echocardiogram.  It may be just simply related to his shape of her chest and her stature she is very tiny.  Since she does have a palpitation we will put Zio patch on her for 2 weeks to see if she had any significant arrhythmia.  I explained to her that we do see this quite often after COVID people coming to 04/11/22 with palpitation and shortness of breath, usually this workup and up being negative.  But clearly need to be done to make sure we rule out any significant pathology. Overall she is tiny and she admits she does not eat well.  She is doing plenty of water and I encouraged her however to have 3 meals a day so far she does not eat breakfast she eats lunch and dinner  nothing else.  And she waits only 39.9 kg. I suspect also some component of anxiety here and may be even some eating disorder.  She started taking some medication however I encouraged her strongly to see psychotherapist.     Medication Adjustments/Labs and Tests Ordered: Current medicines are reviewed at length with the patient today.  Concerns regarding medicines are outlined above.  Orders Placed This Encounter  Procedures   LONG TERM MONITOR (3-14 DAYS)   EKG 12-Lead   ECHOCARDIOGRAM  COMPLETE   No orders of the defined types were placed in this encounter.   Signed, Park Liter, MD, [facility]. 04/12/2022 12:03 PM    [facility]

## 2022-04-14 ENCOUNTER — Telehealth: Payer: Self-pay

## 2022-04-14 NOTE — Telephone Encounter (Signed)
Results reviewed with pt as per Dr. Krasowski's note.  Pt verbalized understanding and had no additional questions. Routed to PCP  

## 2022-05-11 ENCOUNTER — Telehealth: Payer: Self-pay

## 2022-05-11 NOTE — Telephone Encounter (Signed)
Results reviewed with pt as per Dr. Krasowski's note.  Pt verbalized understanding and had no additional questions. Routed to PCP  

## 2022-07-03 ENCOUNTER — Ambulatory Visit: Payer: Managed Care, Other (non HMO) | Attending: Cardiology | Admitting: Cardiology

## 2022-07-03 ENCOUNTER — Encounter: Payer: Self-pay | Admitting: Cardiology

## 2022-07-03 VITALS — BP 110/70 | HR 94 | Ht 63.0 in | Wt 96.0 lb

## 2022-07-03 DIAGNOSIS — Z8616 Personal history of COVID-19: Secondary | ICD-10-CM | POA: Diagnosis not present

## 2022-07-03 DIAGNOSIS — R0602 Shortness of breath: Secondary | ICD-10-CM | POA: Diagnosis not present

## 2022-07-03 DIAGNOSIS — R002 Palpitations: Secondary | ICD-10-CM

## 2022-07-03 NOTE — Patient Instructions (Signed)
Medication Instructions:  Your physician recommends that you continue on your current medications as directed. Please refer to the Current Medication list given to you today.  *If you need a refill on your cardiac medications before your next appointment, please call your pharmacy*   Lab Work: None Ordered If you have labs (blood work) drawn today and your tests are completely normal, you will receive your results only by: MyChart Message (if you have MyChart) OR A paper copy in the mail If you have any lab test that is abnormal or we need to change your treatment, we will call you to review the results.   Testing/Procedures: Your physician has requested that you have an echocardiogram. Echocardiography is a painless test that uses sound waves to create images of your heart. It provides your doctor with information about the size and shape of your heart and how well your heart's chambers and valves are working. This procedure takes approximately one hour. There are no restrictions for this procedure.    Follow-Up: At [facility], you and your health needs are our priority.  As part of our continuing mission to provide you with exceptional heart care, we have created designated Provider Care Teams.  These Care Teams include your primary Cardiologist (physician) and Advanced Practice Providers (APPs -  Physician Assistants and Nurse Practitioners) who all work together to provide you with the care you need, when you need it.  We recommend signing up for the patient portal called "MyChart".  Sign up information is provided on this After Visit Summary.  MyChart is used to connect with patients for Virtual Visits (Telemedicine).  Patients are able to view lab/test results, encounter notes, upcoming appointments, etc.  Non-urgent messages can be sent to your provider as well.   To learn more about what you can do with MyChart, go to [email or web address].     Other  Instructions Echocardiogram An echocardiogram is a test that uses sound waves (ultrasound) to produce images of the heart. Images from an echocardiogram can provide important information about: Heart size and shape. The size and thickness and movement of your heart's walls. Heart muscle function and strength. Heart valve function or if you have stenosis. Stenosis is when the heart valves are too narrow. If blood is flowing backward through the heart valves (regurgitation). A tumor or infectious growth around the heart valves. Areas of heart muscle that are not working well because of poor blood flow or injury from a heart attack. Aneurysm detection. An aneurysm is a weak or damaged part of an artery wall. The wall bulges out from the normal force of blood pumping through the body. Tell a health care provider about: Any allergies you have. All medicines you are taking, including vitamins, herbs, eye drops, creams, and over-the-counter medicines. Any blood disorders you have. Any surgeries you have had. Any medical conditions you have. Whether you are pregnant or may be pregnant. What are the risks? Generally, this is a safe test. However, problems may occur, including an allergic reaction to dye (contrast) that may be used during the test. What happens before the test? No specific preparation is needed. You may eat and drink normally. What happens during the test? You will take off your clothes from the waist up and put on a hospital gown. Electrodes or electrocardiogram (ECG)patches may be placed on your chest. The electrodes or patches are then connected to a device that monitors your heart rate and rhythm. You will lie down on a table  for an ultrasound exam. A gel will be applied to your chest to help sound waves pass through your skin. A handheld device, called a transducer, will be pressed against your chest and moved over your heart. The transducer produces sound waves that travel to  your heart and bounce back (or "echo" back) to the transducer. These sound waves will be captured in real-time and changed into images of your heart that can be viewed on a video monitor. The images will be recorded on a computer and reviewed by your health care provider. You may be asked to change positions or hold your breath for a short time. This makes it easier to get different views or better views of your heart. In some cases, you may receive contrast through an IV in one of your veins. This can improve the quality of the pictures from your heart. The procedure may vary among health care providers and hospitals.   What can I expect after the test? You may return to your normal, everyday life, including diet, activities, and medicines, unless your health care provider tells you not to do that. Follow these instructions at home: It is up to you to get the results of your test. Ask your health care provider, or the department that is doing the test, when your results will be ready. Keep all follow-up visits. This is important. Summary An echocardiogram is a test that uses sound waves (ultrasound) to produce images of the heart. Images from an echocardiogram can provide important information about the size and shape of your heart, heart muscle function, heart valve function, and other possible heart problems. You do not need to do anything to prepare before this test. You may eat and drink normally. After the echocardiogram is completed, you may return to your normal, everyday life, unless your health care provider tells you not to do that. This information is not intended to replace advice given to you by your health care provider. Make sure you discuss any questions you have with your health care provider. Document Revised: 11/04/2019 Document Reviewed: 11/04/2019 Elsevier Patient Education  2021 [employer]      Follow-Up: At [facility], you and your health needs are our priority.   As part of our continuing mission to provide you with exceptional heart care, we have created designated Provider Care Teams.  These Care Teams include your primary Cardiologist (physician) and Advanced Practice Providers (APPs -  Physician Assistants and Nurse Practitioners) who all work together to provide you with the care you need, when you need it.  We recommend signing up for the patient portal called "MyChart".  Sign up information is provided on this After Visit Summary.  MyChart is used to connect with patients for Virtual Visits (Telemedicine).  Patients are able to view lab/test results, encounter notes, upcoming appointments, etc.  Non-urgent messages can be sent to your provider as well.   To learn more about what you can do with MyChart, go to [email or web address].    Your next appointment:   1 year(s)  The format for your next appointment:   In Person  Provider:   Gypsy Balsam, MD    Other Instructions NA

## 2022-07-03 NOTE — Progress Notes (Signed)
Cardiology Office Note:    Date:  07/03/2022   ID:  Miranda Patel, DOB 2000/09/16, MRN [id number]  PCP:  Oneita Hurt, No  Cardiologist:  Gypsy Balsam, MD    Referring MD: No ref. provider found   Chief Complaint  Patient presents with   Follow-up  Doing much better  History of Present Illness:    Miranda Patel is a 22 y.o. female she was sent to me because of palpitations some shortness of breath very atypical symptoms after she suffered from COVID 20.  Echocardiogram was done which showed low normal ejection fraction, the monitor did not show any significant arrhythmia.  She is overall doing much better she is back to herself she did take hydralazine before she stopped it feels much better.  She is taking nortriptyline which gave her some muscle spasm but she is thinking about stopping it.  She is thinking about going back to exercises  History reviewed. No pertinent past medical history.  Past Surgical History:  Procedure Laterality Date   WISDOM TOOTH EXTRACTION      Current Medications: Current Meds  Medication Sig   hydrOXYzine (ATARAX) 25 MG tablet Take 25 mg by mouth 2 (two) times daily.   nortriptyline (PAMELOR) 10 MG capsule Take 10 mg by mouth at bedtime.   [DISCONTINUED] sertraline (ZOLOFT) 25 MG tablet Take 25 mg by mouth daily.     Allergies:   Patient has no known allergies.   Social History   Socioeconomic History   Marital status: Single    Spouse name: Not on file   Number of children: Not on file   Years of education: Not on file   Highest education level: Not on file  Occupational History   Not on file  Tobacco Use   Smoking status: Never   Smokeless tobacco: Never  Vaping Use   Vaping Use: Never used  Substance and Sexual Activity   Alcohol use: Never   Drug use: Never   Sexual activity: Yes  Other Topics Concern   Not on file  Social History Narrative   Not on file   Social Determinants of Health   Financial Resource Strain: Not on  file  Food Insecurity: Not on file  Transportation Needs: Not on file  Physical Activity: Not on file  Stress: Not on file  Social Connections: Not on file     Family History: The patient's family history includes Coronary artery disease in her maternal grandmother; Diabetes in her maternal grandmother and mother; Hyperlipidemia in her maternal grandmother. ROS:   Please see the history of present illness.    All 14 point review of systems negative except as described per history of present illness  EKGs/Labs/Other Studies Reviewed:      Recent Labs: 04/10/2022: ALT 12; BUN 11; Creatinine, Ser 0.65; Hemoglobin 12.6; Platelets 305; Potassium 4.1; Sodium 141; TSH 0.563  Recent Lipid Panel No results found for: "CHOL", "TRIG", "HDL", "CHOLHDL", "VLDL", "LDLCALC", "LDLDIRECT"  Physical Exam:    VS:  BP 110/70 (BP Location: Left Arm)   Pulse 94   Ht 5\' 3"  (1.6 m)   Wt 96 lb (43.5 kg)   SpO2 96%   BMI 17.01 kg/m     Wt Readings from Last 3 Encounters:  07/03/22 96 lb (43.5 kg)  04/12/22 88 lb (39.9 kg)  04/10/22 86 lb 13.8 oz (39.4 kg)     GEN:  Well nourished, well developed in no acute distress HEENT: Normal NECK: No JVD; No carotid  bruits LYMPHATICS: No lymphadenopathy CARDIAC: RRR, no murmurs, no rubs, no gallops RESPIRATORY:  Clear to auscultation without rales, wheezing or rhonchi  ABDOMEN: Soft, non-tender, non-distended MUSCULOSKELETAL:  No edema; No deformity  SKIN: Warm and dry LOWER EXTREMITIES: no swelling NEUROLOGIC:  Alert and oriented x 3 PSYCHIATRIC:  Normal affect   ASSESSMENT:    1. Palpitations   2. Shortness of breath   3. History of COVID-19    PLAN:    In order of problems listed above:  Palpitations: Monitor did not show any significant arrhythmia.  Will continue monitoring. Shortness of breath encouraged her to BL be more active.  Echocardiogram showed low normal ejection fraction we will do another echocardiogram a year from  now. History of COVID noted doing well   Medication Adjustments/Labs and Tests Ordered: Current medicines are reviewed at length with the patient today.  Concerns regarding medicines are outlined above.  No orders of the defined types were placed in this encounter.  Medication changes: No orders of the defined types were placed in this encounter.   Signed, Georgeanna Lea, MD, [facility] 07/03/2022 2:59 PM    [facility]

## 2022-07-03 NOTE — Addendum Note (Signed)
Addended by: Heywood Bene on: 07/03/2022 03:05 PM   Modules accepted: Orders

## 2023-11-26 IMAGING — CR DG CHEST 2V
2 series · 2 of 2 positions shown · non-contrast
Comparison: Images of previous study done on 07/30/2003 are not
available for comparison.

CLINICAL DATA: Intermittent hemoptysis x6 months

EXAM:
CHEST - 2 VIEW

[chest pa]
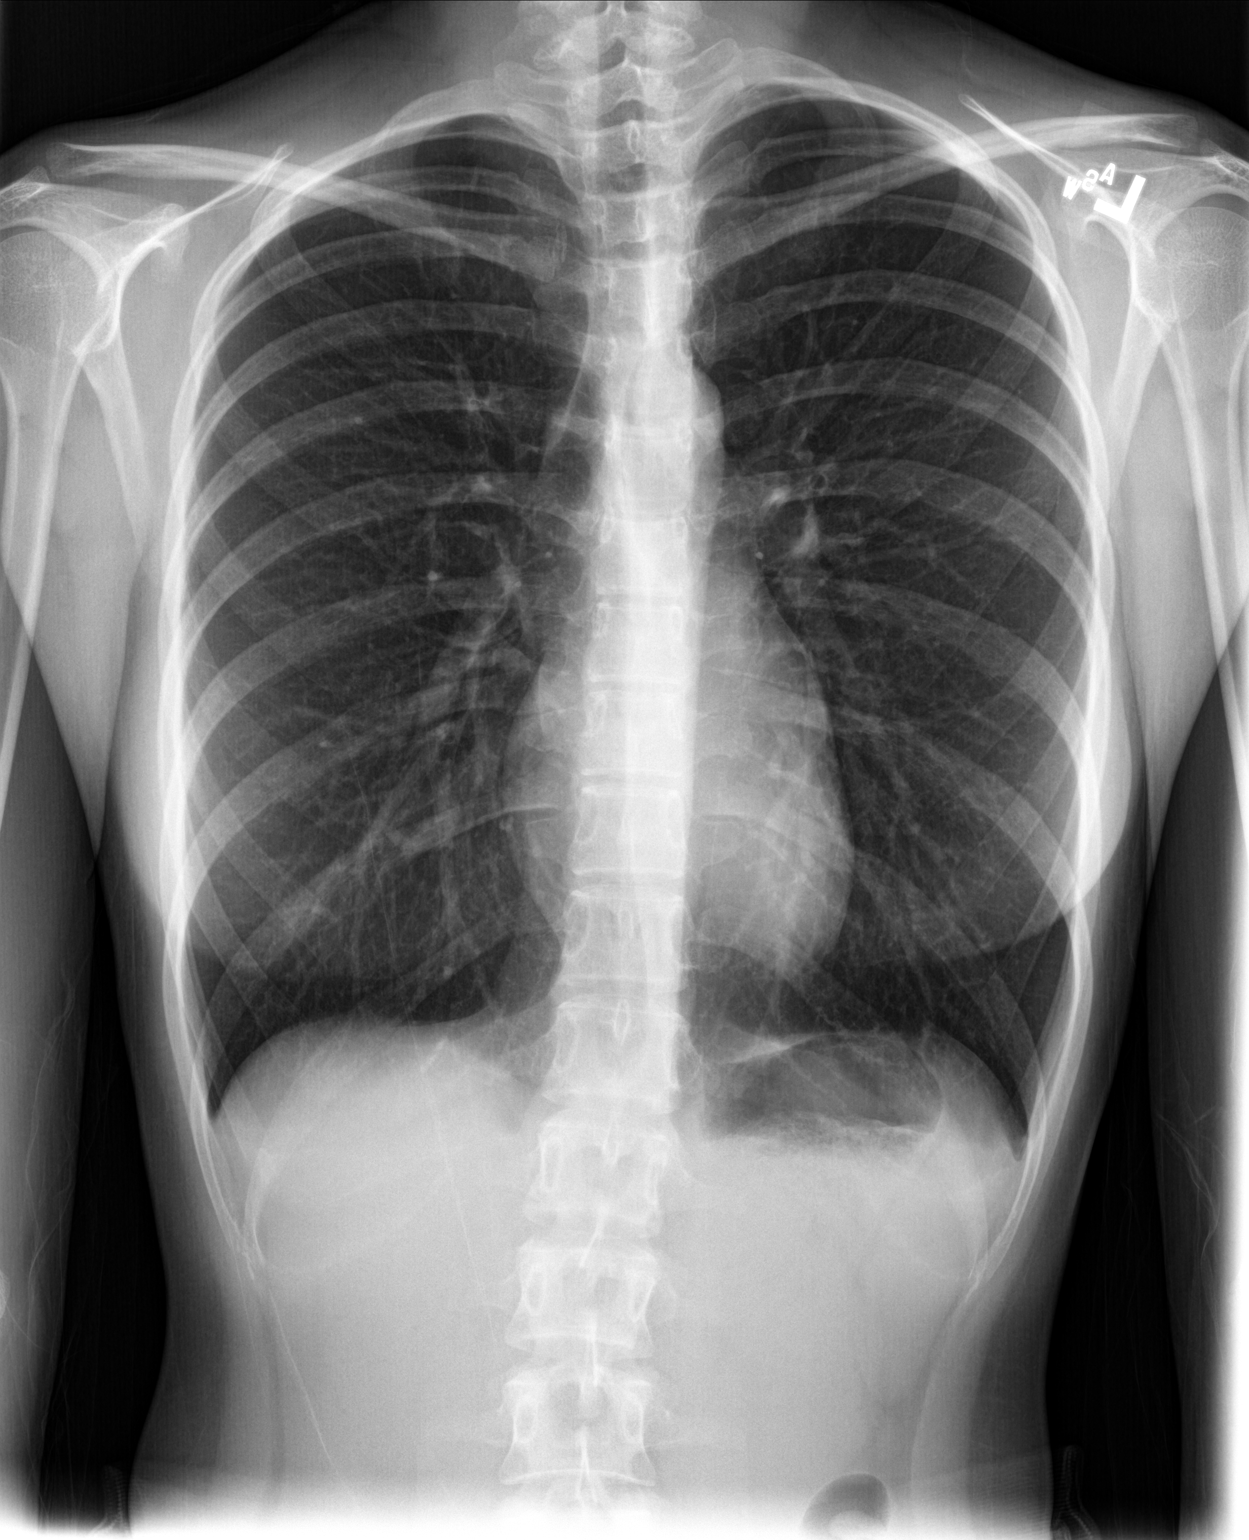

[chest lat]
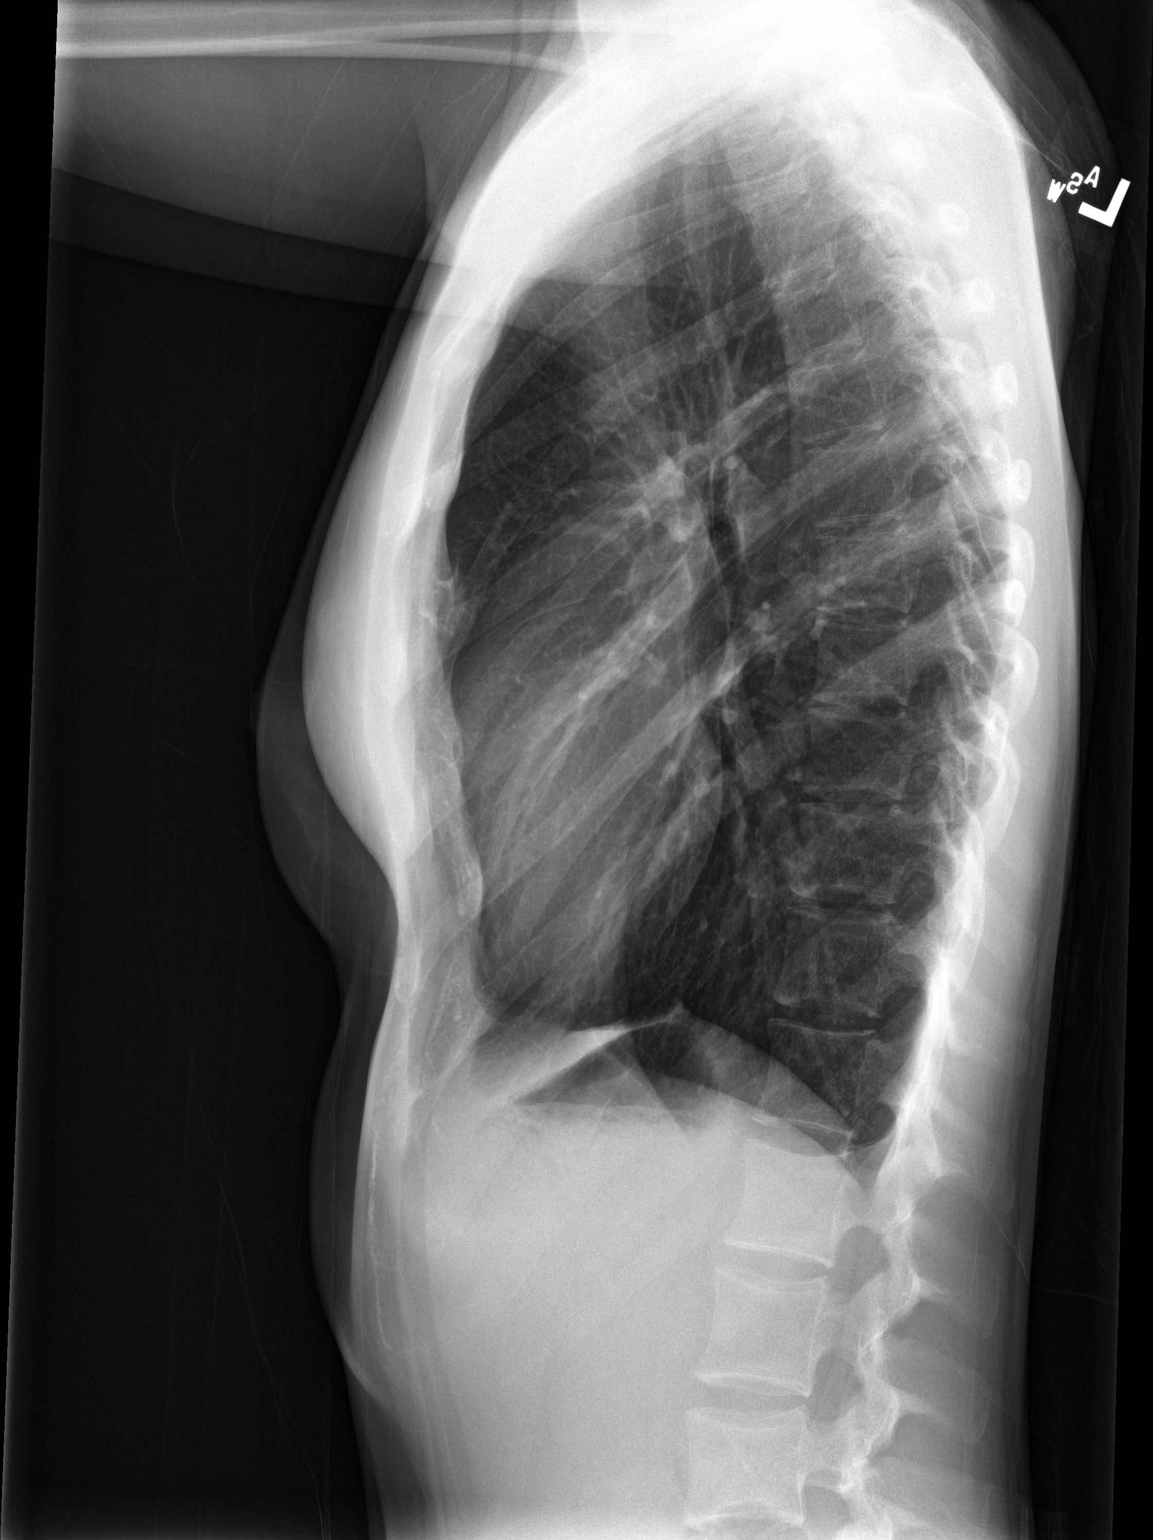

[2 of 2 positions shown; findings below may reference images not displayed]

FINDINGS: Cardiac size is within normal limits. Increase in AP diameter of
chest and flattening of diaphragms may be normal variation due to
patient's body habitus or suggest air trapping. There is no
significant peribronchial thickening. There is no focal pulmonary
consolidation. There is no pleural effusion or pneumothorax.
IMPRESSION: No focal pulmonary infiltrates are seen. No discrete lung nodules
are seen. There is no pleural effusion or pneumothorax.

Increase in AP diameter [DATE] be normal variation due to
patient's body habitus or suggest air trapping.
